# Patient Record
Sex: Female | Born: 1990 | Race: White | Hispanic: No | Marital: Single | Smoking: Never smoker
Health system: Southern US, Community
[De-identification: ages and names within clinical notes are randomized; demographics above are authoritative.]

## PROBLEM LIST (undated history)

## (undated) ENCOUNTER — Emergency Department (HOSPITAL_BASED_OUTPATIENT_CLINIC_OR_DEPARTMENT_OTHER): Admission: EM | Discharge status: Absent without leave | Payer: BC Managed Care – PPO

## (undated) DIAGNOSIS — J45909 Unspecified asthma, uncomplicated: Secondary | ICD-10-CM

## (undated) DIAGNOSIS — Z9109 Other allergy status, other than to drugs and biological substances: Secondary | ICD-10-CM

## (undated) DIAGNOSIS — N946 Dysmenorrhea, unspecified: Secondary | ICD-10-CM

## (undated) HISTORY — DX: Dysmenorrhea, unspecified: N94.6

## (undated) HISTORY — DX: Unspecified asthma, uncomplicated: J45.909

## (undated) HISTORY — DX: Other allergy status, other than to drugs and biological substances: Z91.09

---

## 2017-03-01 ENCOUNTER — Ambulatory Visit: Payer: BC Managed Care – PPO | Admitting: Obstetrics and Gynecology

## 2017-03-01 ENCOUNTER — Other Ambulatory Visit (HOSPITAL_COMMUNITY)
Admission: RE | Admit: 2017-03-01 | Discharge: 2017-03-01 | Disposition: A | Discharge status: Home / Self Care | Payer: BC Managed Care – PPO | Source: Ambulatory Visit | Attending: Obstetrics and Gynecology | Admitting: Obstetrics and Gynecology

## 2017-03-01 ENCOUNTER — Encounter: Payer: Self-pay | Admitting: Obstetrics and Gynecology

## 2017-03-01 ENCOUNTER — Other Ambulatory Visit: Payer: Self-pay

## 2017-03-01 VITALS — BP 122/78 | HR 92 | Resp 14 | Ht 64.5 in | Wt 137.0 lb

## 2017-03-01 DIAGNOSIS — Z124 Encounter for screening for malignant neoplasm of cervix: Secondary | ICD-10-CM | POA: Insufficient documentation

## 2017-03-01 DIAGNOSIS — Z0000 Encounter for general adult medical examination without abnormal findings: Secondary | ICD-10-CM

## 2017-03-01 DIAGNOSIS — Z01419 Encounter for gynecological examination (general) (routine) without abnormal findings: Secondary | ICD-10-CM

## 2017-03-01 DIAGNOSIS — J45909 Unspecified asthma, uncomplicated: Secondary | ICD-10-CM | POA: Insufficient documentation

## 2017-03-01 MED ORDER — LEVONORGESTREL-ETHINYL ESTRAD 0.1-20 MG-MCG PO TABS: 1.0000 | ORAL_TABLET | Freq: Every day | ORAL | 3 refills | Status: DC

## 2017-03-01 NOTE — Patient Instructions (Signed)

## 2017-03-01 NOTE — Progress Notes (Signed)
27 y.o. G0P0000 SingleCaucasianF here for annual exam.   Period Cycle (Days): 28 Period Duration (Days): 3 days  Period Pattern: Regular Menstrual Flow: Moderate Menstrual Control: Maxi pad, Tampon Menstrual Control Change Freq (Hours): changes tampon every 4-5 hours  Dysmenorrhea: (!) Moderate Dysmenorrhea Symptoms: Cramping  Cramps are tolerable. Lives with her boyfriend of 3 years.   Patient's last menstrual period was 02/15/2017.          Sexually active: Yes.    The current method of family planning is OCP (estrogen/progesterone).    Exercising: Yes.    cardio/ weights  Smoker:  no  Health Maintenance: Pap:  2018 WNL per patient ([place]) History of abnormal Pap:  no TDaP:  unsure Gardasil: no    reports that  has never smoked. she has never used smokeless tobacco. She reports that she drinks about 0.6 oz of alcohol per week. She reports that she does not use drugs. 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 Boyfriend is a 1st year Careers information officer.   Past Medical History:  Diagnosis Date  . Asthma   . Dysmenorrhea   . Environmental allergies     History reviewed. No pertinent surgical history.  Current Outpatient Medications  Medication Sig Dispense Refill  . albuterol (VENTOLIN HFA) 108 (90 Base) MCG/ACT inhaler Inhale into the lungs every 6 (six) hours as needed for wheezing or shortness of breath.    . cetirizine (ZYRTEC) 10 MG tablet Take 10 mg by mouth daily.    Marland Kitchen levonorgestrel-ethinyl estradiol (ORSYTHIA) 0.1-20 MG-MCG tablet Take 1 tablet by mouth daily.     No current facility-administered medications for this visit.     Family History  Problem Relation Age of Onset  . Pancreatic cancer Maternal Grandmother   . Breast cancer Maternal Grandmother   Early 70's with breast cancer, late 70's with breast cancer. Doesn't know dad's FH.  Review of Systems  Constitutional: Negative.   HENT: Negative.   Eyes: Negative.   Respiratory: Negative.   Cardiovascular:  Negative.   Gastrointestinal: Negative.   Endocrine: Negative.   Genitourinary: Negative.   Musculoskeletal: Negative.   Skin: Negative.   Allergic/Immunologic: Negative.   Neurological: Negative.   Psychiatric/Behavioral: Negative.     Exam:   BP 122/78 (BP Location: Right Arm, Patient Position: Sitting, Cuff Size: Normal)   Pulse 92   Resp 14   Ht 5' 4.5" (1.638 m)   Wt 137 lb (62.1 kg)   LMP 02/15/2017   BMI 23.15 kg/m   Weight change: @WEIGHTCHANGE @ Height:   Height: 5' 4.5" (163.8 cm)  Ht Readings from Last 3 Encounters:  03/01/17 5' 4.5" (1.638 m)    General appearance: alert, cooperative and appears stated age Head: Normocephalic, without obvious abnormality, atraumatic Neck: no adenopathy, supple, symmetrical, trachea midline and thyroid normal to inspection and palpation Lungs: clear to auscultation bilaterally Cardiovascular: regular rate and rhythm Breasts: normal appearance, no masses or tenderness Abdomen: soft, non-tender; non distended,  no masses,  no organomegaly Extremities: extremities normal, atraumatic, no cyanosis or edema Skin: Skin color, texture, turgor normal. No rashes or lesions Lymph nodes: Cervical, supraclavicular, and axillary nodes normal. No abnormal inguinal nodes palpated Neurologic: Grossly normal   Pelvic: External genitalia:  no lesions              Urethra:  normal appearing urethra with no masses, tenderness or lesions              Bartholins and Skenes: normal  Vagina: normal appearing vagina with normal color and discharge, no lesions              Cervix: no lesions               Bimanual Exam:  Uterus:  normal size, contour, position, consistency, mobility, non-tender and anteverted              Adnexa: no mass, fullness, tenderness               Rectovaginal: Confirms               Anus:  normal sphincter tone, no lesions  Chaperone was present for exam.  A:  Well Woman with normal exam  P:   Pap with  reflex hpv  Screening labs  Discussed breast self exam  Discussed calcium and vit D intake

## 2017-03-02 LAB — COMPREHENSIVE METABOLIC PANEL
ALBUMIN: 4.5 g/dL (ref 3.5–5.5)
ALT: 55 IU/L — ABNORMAL HIGH (ref 0–32)
AST: 37 IU/L (ref 0–40)
Albumin/Globulin Ratio: 1.7 (ref 1.2–2.2)
Alkaline Phosphatase: 86 IU/L (ref 39–117)
BILIRUBIN TOTAL: 0.3 mg/dL (ref 0.0–1.2)
BUN / CREAT RATIO: 16 (ref 9–23)
BUN: 15 mg/dL (ref 6–20)
CHLORIDE: 106 mmol/L (ref 96–106)
CO2: 22 mmol/L (ref 20–29)
CREATININE: 0.94 mg/dL (ref 0.57–1.00)
Calcium: 9.6 mg/dL (ref 8.7–10.2)
GFR calc non Af Amer: 84 mL/min/{1.73_m2} (ref >59)
GFR, EST AFRICAN AMERICAN: 97 mL/min/{1.73_m2} (ref >59)
GLOBULIN, TOTAL: 2.7 g/dL (ref 1.5–4.5)
GLUCOSE: 74 mg/dL (ref 65–99)
Potassium: 4.9 mmol/L (ref 3.5–5.2)
SODIUM: 143 mmol/L (ref 134–144)
TOTAL PROTEIN: 7.2 g/dL (ref 6.0–8.5)

## 2017-03-02 LAB — CBC
HEMATOCRIT: 41.6 % (ref 34.0–46.6)
HEMOGLOBIN: 13.7 g/dL (ref 11.1–15.9)
MCH: 29.5 pg (ref 26.6–33.0)
MCHC: 32.9 g/dL (ref 31.5–35.7)
MCV: 90 fL (ref 79–97)
Platelets: 227 10*3/uL (ref 150–379)
RBC: 4.65 x10E6/uL (ref 3.77–5.28)
RDW: 13.7 % (ref 12.3–15.4)
WBC: 8.9 10*3/uL (ref 3.4–10.8)

## 2017-03-02 LAB — LIPID PANEL
CHOLESTEROL TOTAL: 197 mg/dL (ref 100–199)
Chol/HDL Ratio: 3 ratio (ref 0.0–4.4)
HDL: 65 mg/dL (ref >39)
LDL CALC: 107 mg/dL — AB (ref 0–99)
Triglycerides: 125 mg/dL (ref 0–149)
VLDL Cholesterol Cal: 25 mg/dL (ref 5–40)

## 2017-03-03 ENCOUNTER — Telehealth: Payer: Self-pay | Admitting: *Deleted

## 2017-03-03 DIAGNOSIS — R899 Unspecified abnormal finding in specimens from other organs, systems and tissues: Secondary | ICD-10-CM

## 2017-03-03 LAB — CYTOLOGY - PAP: Diagnosis: NEGATIVE

## 2017-03-03 NOTE — Telephone Encounter (Signed)
-----   Message from Romualdo BolkJill Evelyn Jertson, MD sent at 03/03/2017 11:17 AM EST ----- Please inform the patient that one of her LFT's was slightly elevated. She should avoid ETOH and tylenol and have a repeat liver panel checked in 1-2 months. If still elevated will further evaluate Her LDL was minimally elevated, not significant.  Pap is pending

## 2017-03-03 NOTE — Telephone Encounter (Signed)
Left message to call regarding lab results -eh 

## 2017-03-15 NOTE — Telephone Encounter (Signed)
Patient returned call to Emily. °

## 2017-03-15 NOTE — Telephone Encounter (Signed)
Left message for patient to return call.

## 2017-03-15 NOTE — Telephone Encounter (Signed)
Returned call to patient. Patient notified of results and verbalized understanding. Lab appointment scheduled for Wednesday 04/12/17 at 1530. Patient agreeable to date and time of appointment. Future order placed for liver panel. Patient advised to avoid ETOH and tylenol prior to appointment. Patient agreeable.   Patient agreeable to disposition. Will close encounter.

## 2017-03-26 ENCOUNTER — Emergency Department (HOSPITAL_COMMUNITY): Payer: BC Managed Care – PPO

## 2017-03-26 ENCOUNTER — Other Ambulatory Visit: Payer: Self-pay

## 2017-03-26 ENCOUNTER — Encounter (HOSPITAL_COMMUNITY): Payer: Self-pay | Admitting: Emergency Medicine

## 2017-03-26 ENCOUNTER — Emergency Department (HOSPITAL_COMMUNITY)
Admission: EM | Admit: 2017-03-26 | Discharge: 2017-03-26 | Disposition: A | Discharge status: Home / Self Care | Payer: BC Managed Care – PPO | Attending: Emergency Medicine | Admitting: Emergency Medicine

## 2017-03-26 DIAGNOSIS — R1031 Right lower quadrant pain: Secondary | ICD-10-CM | POA: Insufficient documentation

## 2017-03-26 DIAGNOSIS — N390 Urinary tract infection, site not specified: Secondary | ICD-10-CM | POA: Insufficient documentation

## 2017-03-26 DIAGNOSIS — R319 Hematuria, unspecified: Secondary | ICD-10-CM | POA: Diagnosis not present

## 2017-03-26 DIAGNOSIS — J45909 Unspecified asthma, uncomplicated: Secondary | ICD-10-CM | POA: Diagnosis not present

## 2017-03-26 DIAGNOSIS — R109 Unspecified abdominal pain: Secondary | ICD-10-CM

## 2017-03-26 LAB — COMPREHENSIVE METABOLIC PANEL
ALBUMIN: 4 g/dL (ref 3.5–5.0)
ALK PHOS: 55 U/L (ref 38–126)
ALT: 21 U/L (ref 14–54)
ANION GAP: 12 (ref 5–15)
AST: 27 U/L (ref 15–41)
BILIRUBIN TOTAL: 0.8 mg/dL (ref 0.3–1.2)
BUN: 11 mg/dL (ref 6–20)
CALCIUM: 9.1 mg/dL (ref 8.9–10.3)
CO2: 20 mmol/L — AB (ref 22–32)
Chloride: 104 mmol/L (ref 101–111)
Creatinine, Ser: 1.18 mg/dL — ABNORMAL HIGH (ref 0.44–1.00)
GFR calc non Af Amer: >60 mL/min (ref >60)
Glucose, Bld: 95 mg/dL (ref 65–99)
POTASSIUM: 3.5 mmol/L (ref 3.5–5.1)
SODIUM: 136 mmol/L (ref 135–145)
TOTAL PROTEIN: 7.2 g/dL (ref 6.5–8.1)

## 2017-03-26 LAB — CBC
HEMATOCRIT: 43.6 % (ref 36.0–46.0)
HEMOGLOBIN: 14.6 g/dL (ref 12.0–15.0)
MCH: 30 pg (ref 26.0–34.0)
MCHC: 33.5 g/dL (ref 30.0–36.0)
MCV: 89.7 fL (ref 78.0–100.0)
Platelets: 172 10*3/uL (ref 150–400)
RBC: 4.86 MIL/uL (ref 3.87–5.11)
RDW: 13.7 % (ref 11.5–15.5)
WBC: 4.8 10*3/uL (ref 4.0–10.5)

## 2017-03-26 LAB — URINALYSIS, ROUTINE W REFLEX MICROSCOPIC
Bilirubin Urine: NEGATIVE
Glucose, UA: NEGATIVE mg/dL
Ketones, ur: 5 mg/dL — AB
Nitrite: NEGATIVE
PH: 5 (ref 5.0–8.0)
Protein, ur: 30 mg/dL — AB
SPECIFIC GRAVITY, URINE: 1.031 — AB (ref 1.005–1.030)

## 2017-03-26 LAB — LIPASE, BLOOD: Lipase: 20 U/L (ref 11–51)

## 2017-03-26 LAB — I-STAT BETA HCG BLOOD, ED (MC, WL, AP ONLY)

## 2017-03-26 MED ORDER — IOPAMIDOL (ISOVUE-300) INJECTION 61%
INTRAVENOUS | Status: AC
  Administered 2017-03-26: 100 mL
  Filled 2017-03-26: qty 100

## 2017-03-26 MED ORDER — NITROFURANTOIN MONOHYD MACRO 100 MG PO CAPS: 100.0000 mg | ORAL_CAPSULE | Freq: Two times a day (BID) | ORAL | 0 refills | Status: DC

## 2017-03-26 MED ORDER — ONDANSETRON HCL 4 MG PO TABS: 4.0000 mg | ORAL_TABLET | Freq: Four times a day (QID) | ORAL | 0 refills | Status: DC

## 2017-03-26 MED ORDER — TRAMADOL HCL 50 MG PO TABS: 50.0000 mg | ORAL_TABLET | Freq: Four times a day (QID) | ORAL | 0 refills | Status: DC | PRN

## 2017-03-26 NOTE — ED Triage Notes (Signed)
Pt. Stated, yesterday I had N/V and only drank Gatorade and some chicken broth. It went away. Today Im having only bad stomach pain.Went to [facility] told just to come here.

## 2017-03-26 NOTE — Discharge Instructions (Addendum)
Please read attached information. If you experience any new or worsening signs or symptoms please return to the emergency room for evaluation. If your symptoms persist beyond two days please return for evaluation. Please use medication prescribed only as directed and discontinue taking if you have any concerning signs or symptoms.

## 2017-03-26 NOTE — ED Provider Notes (Signed)
[facility] Provider Note   CSN: [id number] Arrival date & time: 03/26/17  1147   History   Chief Complaint Chief Complaint  Patient presents with  . Abdominal Pain    HPI Melanie Shields is a 27 y.o. female.  HPI    27 year old female presents today with complaints of abdominal pain.  Patient notes over the last 3 days she has had nausea vomiting and diarrhea and minor abdominal cramping.  She notes the symptoms improved with no vomiting or diarrhea but today developed right lower quadrant abdominal pain.  She notes this is generally uncomfortable throughout worsening of the periumbilical region and more severe right lower quadrant.  Patient notes she is tolerating p.o., she reports originally she did have a fever no longer febrile.  She denies any vaginal discharge or bleeding.  Patient reports symptoms are made worse with walking or movement.     Past Medical History:  Diagnosis Date  . Asthma   . Dysmenorrhea   . Environmental allergies     Patient Active Problem List   Diagnosis Date Noted  . Asthma     History reviewed. No pertinent surgical history.   OB History    Gravida  0   Para  0   Term  0   Preterm  0   AB  0   Living  0     SAB  0   TAB  0   Ectopic  0   Multiple  0   Live Births  0           Home Medications    Prior to Admission medications   Medication Sig Start Date End Date Taking? Authorizing Provider  albuterol (VENTOLIN HFA) 108 (90 Base) MCG/ACT inhaler Inhale into the lungs every 6 (six) hours as needed for wheezing or shortness of breath.   Yes [provider]  levonorgestrel-ethinyl estradiol (ORSYTHIA) 0.1-20 MG-MCG tablet Take 1 tablet by mouth daily. 03/01/17  Yes Romualdo BolkJertson, Jill Evelyn, MD  cetirizine (ZYRTEC) 10 MG tablet Take 10 mg by mouth daily.    [provider]  nitrofurantoin, macrocrystal-monohydrate, (MACROBID) 100 MG capsule Take 1 capsule (100  mg total) by mouth 2 (two) times daily. 03/26/17   Jacarius Handel, Tinnie GensJeffrey, PA-C  ondansetron (ZOFRAN) 4 MG tablet Take 1 tablet (4 mg total) by mouth every 6 (six) hours. 03/26/17   Nayelie Gionfriddo, Tinnie GensJeffrey, PA-C  traMADol (ULTRAM) 50 MG tablet Take 1 tablet (50 mg total) by mouth every 6 (six) hours as needed. 03/26/17   Eyvonne MechanicHedges, Norvil Martensen, PA-C    Family History Family History  Problem Relation Age of Onset  . Pancreatic cancer Maternal Grandmother   . Breast cancer Maternal Grandmother    Social History Social History   Tobacco Use  . Smoking status: Never Smoker  . Smokeless tobacco: Never Used  Substance Use Topics  . Alcohol use: Yes    Alcohol/week: 0.6 oz    Types: 1 Standard drinks or equivalent per week  . Drug use: No     Allergies   Amoxicillin and Omnicef [cefdinir]   Review of Systems Review of Systems  All other systems reviewed and are negative.   Physical Exam Updated Vital Signs BP (!) 114/93   Pulse 91   Temp 98 F (36.7 C) (Oral)   Resp 16   Ht 5\' 5"  (1.651 m)   Wt 61.2 kg (135 lb)   LMP 03/05/2017   SpO2 100%   BMI 22.47  kg/m   Physical Exam  Constitutional: She is oriented to person, place, and time. She appears well-developed and well-nourished.  HENT:  Head: Normocephalic and atraumatic.  Eyes: Pupils are equal, round, and reactive to light. Conjunctivae are normal. Right eye exhibits no discharge. Left eye exhibits no discharge. No scleral icterus.  Neck: Normal range of motion. No JVD present. No tracheal deviation present.  Pulmonary/Chest: Effort normal. No stridor.  Abdominal:  Abdomen with generalized tenderness, nonfocal  Neurological: She is alert and oriented to person, place, and time. Coordination normal.  Psychiatric: She has a normal mood and affect. Her behavior is normal. Judgment and thought content normal.  Nursing note and vitals reviewed.    ED Treatments / Results  Labs (all labs ordered are listed, but only abnormal results are  displayed) Labs Reviewed  COMPREHENSIVE METABOLIC PANEL - Abnormal; Notable for the following components:      Result Value   CO2 20 (*)    Creatinine, Ser 1.18 (*)    All other components within normal limits  URINALYSIS, ROUTINE W REFLEX MICROSCOPIC - Abnormal; Notable for the following components:   Color, Urine AMBER (*)    APPearance CLOUDY (*)    Specific Gravity, Urine 1.031 (*)    Hgb urine dipstick MODERATE (*)    Ketones, ur 5 (*)    Protein, ur 30 (*)    Leukocytes, UA LARGE (*)    Bacteria, UA FEW (*)    Squamous Epithelial / LPF 0-5 (*)    All other components within normal limits  LIPASE, BLOOD  CBC  I-STAT BETA HCG BLOOD, ED ([facility], [facility], [facility] ONLY)    EKG None  Radiology Ct Abdomen Pelvis W Contrast  Result Date: 03/26/2017 CLINICAL DATA:  Nausea and vomiting yesterday. Right lower quadrant pain today. Appendicitis suspected. EXAM: CT ABDOMEN AND PELVIS WITH CONTRAST TECHNIQUE: Multidetector CT imaging of the abdomen and pelvis was performed using the standard protocol following bolus administration of intravenous contrast. CONTRAST:  ISOVUE-300 IOPAMIDOL (ISOVUE-300) INJECTION 61% COMPARISON:  None. FINDINGS: Lower chest: No acute abnormality. Hepatobiliary: No focal liver abnormality is seen. No gallstones, gallbladder wall thickening, or biliary dilatation. Pancreas: Unremarkable. No pancreatic ductal dilatation or surrounding inflammatory changes. Spleen: Normal in size without focal abnormality. Adrenals/Urinary Tract: Adrenal glands are unremarkable. Kidneys are normal, without renal calculi, focal lesion, or hydronephrosis. Bladder is unremarkable. Stomach/Bowel: The stomach and small bowel are normal. The colon is normal. High attenuation the proximal appendix could represent a small appendicolith versus contrast given contrast in adjacent colon. The appendix measures up to 7 mm which is borderline. However, there is no adjacent stranding. The proximal appendix is  fluid-filled above the more distal appendix is thin walled and air-filled. Vascular/Lymphatic: The aorta and branching vessels are normal. A few shotty nodes are seen in the left periaortic region, likely reactive. A representative node on series 3, image 27 measures 10 mm. No adenopathy. Reproductive: Uterus and bilateral adnexa are unremarkable. Other: No free air or free fluid.  No other acute abnormalities. Musculoskeletal: No acute or significant osseous findings. IMPRESSION: 1. There is no convincing evidence of appendicitis. While there is the possibility of a proximal appendicolith and a borderline caliber of the proximal appendix, the lack of adjacent stranding, significant wall thickening, and a normal distal appendix suggests no appendicitis. If there is high clinical concern, a short-term follow-up CT scan could be performed. Electronically Signed   By: Gerome Sam III M.D   On: 03/26/2017 18:01  Procedures Procedures (including critical care time)  Medications Ordered in ED Medications  iopamidol (ISOVUE-300) 61 % injection (100 mLs  Contrast Given 03/26/17 1657)     Initial Impression / Assessment and Plan / ED Course  I have reviewed the triage vital signs and the nursing notes.  Pertinent labs & imaging results that were available during my care of the patient were reviewed by me and considered in my medical decision making (see chart for details).      Final Clinical Impressions(s) / ED Diagnoses   Final diagnoses:  Urinary tract infection with hematuria, site unspecified  Abdominal pain, unspecified abdominal location    Labs: CBC, CMP, lipase-creatinine 1.18 -likely secondary to dehydration  Imaging: CT abdomen pelvis  Consults:  Therapeutics:  Discharge Meds: Macrobid, Zofran, Ultram   Assessment/Plan: 27 year old female presents today with complaints of abdominal pain.  Patient complains of right lower quadrant abdominal pain but has tenderness  generalized and nonfocal.  She had serial abdominal exams with different areas causing tenderness.  Patient is afebrile well-appearing in no acute distress.  She has no elevation in her white count at this time.  CT abdomen pelvis without acute findings, low suspicion for acute appendicitis, diverticulitis, ovarian torsion, or any other life-threatening etiology at this time.  Patient does have urine consistent with urinary tract infection, she is asymptomatic but given her abdominal pain will be treated.  I discussed that although findings at this time showed no acute abnormalities patient persisted with abdominal pain or had any new or worsening signs or symptoms she needed to return to the emergency room.  Patient reports that she will return in 2 days if symptoms persist for repeat evaluation.  Patient instructed use medication as directed, she verbalized her understanding and agreement to today's plan had no further questions or concerns at the time of discharge.    ED Discharge Orders        Ordered    nitrofurantoin, macrocrystal-monohydrate, (MACROBID) 100 MG capsule  2 times daily     03/26/17 1816    ondansetron (ZOFRAN) 4 MG tablet  Every 6 hours     03/26/17 1817    traMADol (ULTRAM) 50 MG tablet  Every 6 hours PRN     03/26/17 1817       Eyvonne Mechanic, PA-C 03/26/17 Vinetta Bergamo, MD 03/27/17 1401

## 2017-04-12 ENCOUNTER — Other Ambulatory Visit: Payer: BC Managed Care – PPO

## 2017-04-12 ENCOUNTER — Telehealth: Payer: Self-pay | Admitting: Obstetrics and Gynecology

## 2017-04-12 NOTE — Telephone Encounter (Signed)
Patient called and cancelled her appointment today for a liver panel. She said she is sick and will call back to reschedule. I returned her call to let her we got the message.

## 2018-02-03 ENCOUNTER — Other Ambulatory Visit: Payer: Self-pay | Admitting: Obstetrics and Gynecology

## 2018-02-05 NOTE — Telephone Encounter (Signed)
Message left to return call to [facility] at [phone].    Need patient to advise if she needs a refill prior to appointment in March and if so, confirm which pharmacy she needs refill to go to.

## 2018-02-09 ENCOUNTER — Other Ambulatory Visit: Payer: Self-pay | Admitting: Obstetrics and Gynecology

## 2018-02-09 NOTE — Telephone Encounter (Signed)
Patient returning a call about a prescription refill.

## 2018-02-12 NOTE — Telephone Encounter (Signed)
Message left to return call to Melanie Shields at [phone].    

## 2018-02-15 MED ORDER — LEVONORGESTREL-ETHINYL ESTRAD 0.1-20 MG-MCG PO TABS: 1.0000 | ORAL_TABLET | Freq: Every day | ORAL | 0 refills | Status: DC

## 2018-02-15 NOTE — Telephone Encounter (Signed)
Medication refill request: Orsythia Last AEX:  03-01-17 JJ  Next AEX: 03-19-2018 Last MMG (if hormonal medication request): n/a Refill authorized: 03-01-17 #3packs, 3RF. Today, please advise.   Spoke with patient. Patient states she will need one pack of OCP refilled prior to appointment on 03-19-2018 with Dr. Oscar La. Medication pended for #1pack, 0RF to pharmacy confirmed as [facility] on [address] in [place]. Please refill if appropriate.

## 2018-03-19 ENCOUNTER — Ambulatory Visit: Payer: BC Managed Care – PPO | Admitting: Obstetrics and Gynecology

## 2018-03-19 ENCOUNTER — Telehealth: Payer: Self-pay | Admitting: Obstetrics and Gynecology

## 2018-03-19 NOTE — Telephone Encounter (Signed)
Patient called and cancelled her AEX with Dr. Oscar La today because she is not feeling well. Patient advised to check in with her PCP about her symptoms and she expressed understanding. She rescheduled with Dr. Oscar La to 04/04/18 at 1:30.

## 2018-03-19 NOTE — Progress Notes (Deleted)
28 y.o. G0P0000 Single White or Caucasian Not Hispanic or Latino female here for annual exam.      No LMP recorded.          Sexually active: {yes no:314532}  The current method of family planning is {contraception:315051}.    Exercising: {yes no:314532}  {types:19826} Smoker:  {YES NO:22349}  Health Maintenance: Pap:03/01/17 nofrma   2018 WNL  Per patient  History of abnormal Pap:  no TDaP:  *** Gardasil: ***   reports that she has never smoked. She has never used smokeless tobacco. She reports current alcohol use of about 1.0 standard drinks of alcohol per week. She reports that she does not use drugs.  Past Medical History:  Diagnosis Date  . Asthma   . Dysmenorrhea   . Environmental allergies     No past surgical history on file.  Current Outpatient Medications  Medication Sig Dispense Refill  . albuterol (VENTOLIN HFA) 108 (90 Base) MCG/ACT inhaler Inhale into the lungs every 6 (six) hours as needed for wheezing or shortness of breath.    . cetirizine (ZYRTEC) 10 MG tablet Take 10 mg by mouth daily.    Marland Kitchen levonorgestrel-ethinyl estradiol (ORSYTHIA) 0.1-20 MG-MCG tablet Take 1 tablet by mouth daily. 1 Package 0  . nitrofurantoin, macrocrystal-monohydrate, (MACROBID) 100 MG capsule Take 1 capsule (100 mg total) by mouth 2 (two) times daily. 10 capsule 0  . ondansetron (ZOFRAN) 4 MG tablet Take 1 tablet (4 mg total) by mouth every 6 (six) hours. 12 tablet 0  . traMADol (ULTRAM) 50 MG tablet Take 1 tablet (50 mg total) by mouth every 6 (six) hours as needed. 10 tablet 0   No current facility-administered medications for this visit.     Family History  Problem Relation Age of Onset  . Pancreatic cancer Maternal Grandmother   . Breast cancer Maternal Grandmother     Review of Systems  Exam:   There were no vitals taken for this visit.  Weight change: @WEIGHTCHANGE @ Height:      Ht Readings from Last 3 Encounters:  03/26/17 5\' 5"  (1.651 m)  03/01/17 5' 4.5" (1.638 m)     General appearance: alert, cooperative and appears stated age Head: Normocephalic, without obvious abnormality, atraumatic Neck: no adenopathy, supple, symmetrical, trachea midline and thyroid {CHL AMB PHY EX THYROID NORM DEFAULT:[phone]::"normal to inspection and palpation"} Lungs: clear to auscultation bilaterally Cardiovascular: regular rate and rhythm Breasts: {Exam; breast:13139::"normal appearance, no masses or tenderness"} Abdomen: soft, non-tender; non distended,  no masses,  no organomegaly Extremities: extremities normal, atraumatic, no cyanosis or edema Skin: Skin color, texture, turgor normal. No rashes or lesions Lymph nodes: Cervical, supraclavicular, and axillary nodes normal. No abnormal inguinal nodes palpated Neurologic: Grossly normal   Pelvic: External genitalia:  no lesions              Urethra:  normal appearing urethra with no masses, tenderness or lesions              Bartholins and Skenes: normal                 Vagina: normal appearing vagina with normal color and discharge, no lesions              Cervix: {CHL AMB PHY EX CERVIX NORM DEFAULT:[phone]::"no lesions"}               Bimanual Exam:  Uterus:  {CHL AMB PHY EX UTERUS NORM DEFAULT:[phone]::"normal size, contour, position, consistency, mobility, non-tender"}  Adnexa: {CHL AMB PHY EX ADNEXA NO MASS DEFAULT:[phone]::"no mass, fullness, tenderness"}               Rectovaginal: Confirms               Anus:  normal sphincter tone, no lesions  Chaperone was present for exam.  A:  Well Woman with normal exam  P:

## 2018-04-04 ENCOUNTER — Ambulatory Visit: Payer: BC Managed Care – PPO | Admitting: Obstetrics and Gynecology

## 2018-05-23 ENCOUNTER — Encounter: Payer: Self-pay | Admitting: Obstetrics and Gynecology

## 2018-05-23 ENCOUNTER — Other Ambulatory Visit: Payer: Self-pay

## 2018-05-23 ENCOUNTER — Ambulatory Visit: Payer: BC Managed Care – PPO | Admitting: Obstetrics and Gynecology

## 2018-05-23 VITALS — BP 122/80 | HR 88 | Temp 98.5°F | Ht 64.5 in | Wt 152.8 lb

## 2018-05-23 DIAGNOSIS — Z3041 Encounter for surveillance of contraceptive pills: Secondary | ICD-10-CM | POA: Diagnosis not present

## 2018-05-23 DIAGNOSIS — Z0000 Encounter for general adult medical examination without abnormal findings: Secondary | ICD-10-CM | POA: Diagnosis not present

## 2018-05-23 DIAGNOSIS — Z01419 Encounter for gynecological examination (general) (routine) without abnormal findings: Secondary | ICD-10-CM | POA: Diagnosis not present

## 2018-05-23 DIAGNOSIS — Z3169 Encounter for other general counseling and advice on procreation: Secondary | ICD-10-CM

## 2018-05-23 MED ORDER — LEVONORGESTREL-ETHINYL ESTRAD 0.1-20 MG-MCG PO TABS: 1.0000 | ORAL_TABLET | Freq: Every day | ORAL | 3 refills | Status: DC

## 2018-05-23 NOTE — Patient Instructions (Signed)
EXERCISE AND DIET:  We recommended that you start or continue a regular exercise program for good health. Regular exercise means any activity that makes your heart beat faster and makes you sweat.  We recommend exercising at least 30 minutes per day at least 3 days a week, preferably 4 or 5.  We also recommend a diet low in fat and sugar.  Inactivity, poor dietary choices and obesity can cause diabetes, heart attack, stroke, and kidney damage, among others.   ° °ALCOHOL AND SMOKING:  Women should limit their alcohol intake to no more than 7 drinks/beers/glasses of wine (combined, not each!) per week. Moderation of alcohol intake to this level decreases your risk of breast cancer and liver damage. And of course, no recreational drugs are part of a healthy lifestyle.  And absolutely no smoking or even second hand smoke. Most people know smoking can cause heart and lung diseases, but did you know it also contributes to weakening of your bones? Aging of your skin?  Yellowing of your teeth and nails? ° °CALCIUM AND VITAMIN D:  Adequate intake of calcium and Vitamin D are recommended.  The recommendations for exact amounts of these supplements seem to change often, but generally speaking 1,000 mg of calcium (between diet and supplement) and 800 units of Vitamin D per day seems prudent. Certain women may benefit from higher intake of Vitamin D.  If you are among these women, your doctor will have told you during your visit.   ° °PAP SMEARS:  Pap smears, to check for cervical cancer or precancers,  have traditionally been done yearly, although recent scientific advances have shown that most women can have pap smears less often.  However, every woman still should have a physical exam from her gynecologist every year. It will include a breast check, inspection of the vulva and vagina to check for abnormal growths or skin changes, a visual exam of the cervix, and then an exam to evaluate the size and shape of the uterus and  ovaries.  And after 28 years of age, a rectal exam is indicated to check for rectal cancers. We will also provide age appropriate advice regarding health maintenance, like when you should have certain vaccines, screening for sexually transmitted diseases, bone density testing, colonoscopy, mammograms, etc.  ° °MAMMOGRAMS:  All women over 40 years old should have a yearly mammogram. Many facilities now offer a "3D" mammogram, which may cost around $50 extra out of pocket. If possible,  we recommend you accept the option to have the 3D mammogram performed.  It both reduces the number of women who will be called back for extra views which then turn out to be normal, and it is better than the routine mammogram at detecting truly abnormal areas.   ° °COLON CANCER SCREENING: Now recommend starting at age 45. At this time colonoscopy is not covered for routine screening until 50. There are take home tests that can be done between 45-49.  ° °COLONOSCOPY:  Colonoscopy to screen for colon cancer is recommended for all women at age 50.  We know, you hate the idea of the prep.  We agree, BUT, having colon cancer and not knowing it is worse!!  Colon cancer so often starts as a polyp that can be seen and removed at colonscopy, which can quite literally save your life!  And if your first colonoscopy is normal and you have no family history of colon cancer, most women don't have to have it again for   10 years.  Once every ten years, you can do something that may end up saving your life, right?  We will be happy to help you get it scheduled when you are ready.  Be sure to check your insurance coverage so you understand how much it will cost.  It may be covered as a preventative service at no cost, but you should check your particular policy.   ° ° ° °Breast Self-Awareness °Breast self-awareness means being familiar with how your breasts look and feel. It involves checking your breasts regularly and reporting any changes to your  health care provider. °Practicing breast self-awareness is important. A change in your breasts can be a sign of a serious medical problem. Being familiar with how your breasts look and feel allows you to find any problems early, when treatment is more likely to be successful. All women should practice breast self-awareness, including women who have had breast implants. °How to do a breast self-exam °One way to learn what is normal for your breasts and whether your breasts are changing is to do a breast self-exam. To do a breast self-exam: °Look for Changes ° °1. Remove all the clothing above your waist. °2. Stand in front of a mirror in a room with good lighting. °3. Put your hands on your hips. °4. Push your hands firmly downward. °5. Compare your breasts in the mirror. Look for differences between them (asymmetry), such as: °? Differences in shape. °? Differences in size. °? Puckers, dips, and bumps in one breast and not the other. °6. Look at each breast for changes in your skin, such as: °? Redness. °? Scaly areas. °7. Look for changes in your nipples, such as: °? Discharge. °? Bleeding. °? Dimpling. °? Redness. °? A change in position. °Feel for Changes °Carefully feel your breasts for lumps and changes. It is best to do this while lying on your back on the floor and again while sitting or standing in the shower or tub with soapy water on your skin. Feel each breast in the following way: °· Place the arm on the side of the breast you are examining above your head. °· Feel your breast with the other hand. °· Start in the nipple area and make ¾ inch (2 cm) overlapping circles to feel your breast. Use the pads of your three middle fingers to do this. Apply light pressure, then medium pressure, then firm pressure. The light pressure will allow you to feel the tissue closest to the skin. The medium pressure will allow you to feel the tissue that is a little deeper. The firm pressure will allow you to feel the tissue  close to the ribs. °· Continue the overlapping circles, moving downward over the breast until you feel your ribs below your breast. °· Move one finger-width toward the center of the body. Continue to use the ¾ inch (2 cm) overlapping circles to feel your breast as you move slowly up toward your collarbone. °· Continue the up and down exam using all three pressures until you reach your armpit. ° °Write Down What You Find ° °Write down what is normal for each breast and any changes that you find. Keep a written record with breast changes or normal findings for each breast. By writing this information down, you do not need to depend only on memory for size, tenderness, or location. Write down where you are in your menstrual cycle, if you are still menstruating. °If you are having trouble noticing differences   in your breasts, do not get discouraged. With time you will become more familiar with the variations in your breasts and more comfortable with the exam. °How often should I examine my breasts? °Examine your breasts every month. If you are breastfeeding, the best time to examine your breasts is after a feeding or after using a breast pump. If you menstruate, the best time to examine your breasts is 5-7 days after your period is over. During your period, your breasts are lumpier, and it may be more difficult to notice changes. °When should I see my health care provider? °See your health care provider if you notice: °· A change in shape or size of your breasts or nipples. °· A change in the skin of your breast or nipples, such as a reddened or scaly area. °· Unusual discharge from your nipples. °· A lump or thick area that was not there before. °· Pain in your breasts. °· Anything that concerns you. ° °Preparing for Pregnancy °If you are considering becoming pregnant, make an appointment to see your regular health care provider to learn how to prepare for a safe and healthy pregnancy (preconception care). During a  preconception care visit, your health care provider will: °· Do a complete physical exam, including a Pap test. °· Take a complete medical history. °· Give you information, answer your questions, and help you resolve problems. °Preconception checklist °Medical history °· Tell your health care provider about any current or past medical conditions. Your pregnancy or your ability to become pregnant may be affected by chronic conditions, such as diabetes, chronic hypertension, and thyroid problems. °· Include your family's medical history as well as your partner's medical history. °· Tell your health care provider about any history of STIs (sexually transmitted infections). These can affect your pregnancy. In some cases, they can be passed to your baby. Discuss any concerns that you have about STIs. °· If indicated, discuss the benefits of genetic testing. This testing will show whether there are any genetic conditions that may be passed from you or your partner to your baby. °· Tell your health care provider about: °? Any problems you have had with conception or pregnancy. °? Any medicines you take. These include vitamins, herbal supplements, and over-the-counter medicines. °? Your history of immunizations. Discuss any vaccinations that you may need. °Diet °· Ask your health care provider what to include in a healthy diet that has a balance of nutrients. This is especially important when you are pregnant or preparing to become pregnant. °· Ask your health care provider to help you reach a healthy weight before pregnancy. °? If you are overweight, you may be at higher risk for certain complications, such as high blood pressure, diabetes, and preterm birth. °? If you are underweight, you are more likely to have a baby who has a low birth weight. °Lifestyle, work, and home °· Let your health care provider know: °? About any lifestyle habits that you have, such as alcohol use, drug use, or smoking. °? About recreational  activities that may put you at risk during pregnancy, such as downhill skiing and certain exercise programs. °? Tell your health care provider about any international travel, especially any travel to places with an active Zika virus outbreak. °? About harmful substances that you may be exposed to at work or at home. These include chemicals, pesticides, radiation, or even litter boxes. °? If you do not feel safe at home. °Mental health °· Tell your health care provider about: °?   Any history of mental health conditions, including feelings of depression, sadness, or anxiety. °? Any medicines that you take for a mental health condition. These include herbs and supplements. °Home instructions to prepare for pregnancy °Lifestyle ° °· Eat a balanced diet. This includes fresh fruits and vegetables, whole grains, lean meats, low-fat dairy products, healthy fats, and foods that are high in fiber. Ask to meet with a nutritionist or registered dietitian for assistance with meal planning and goals. °· Get regular exercise. Try to be active for at least 30 minutes a day on most days of the week. Ask your health care provider which activities are safe during pregnancy. °· Do not use any products that contain nicotine or tobacco, such as cigarettes and e-cigarettes. If you need help quitting, ask your health care provider. °· Do not drink alcohol. °· Do not take illegal drugs. °· Maintain a healthy weight. Ask your health care provider what weight range is right for you. °General instructions °· Keep an accurate record of your menstrual periods. This makes it easier for your health care provider to determine your baby's due date. °· Begin taking prenatal vitamins and folic acid supplements daily as directed by your health care provider. °· Manage any chronic conditions, such as high blood pressure and diabetes, as told by your health care provider. This is important. °How do I know that I am pregnant? °You may be pregnant if you  have been sexually active and you miss your period. Symptoms of early pregnancy include: °· Mild cramping. °· Very light vaginal bleeding (spotting). °· Feeling unusually tired. °· Nausea and vomiting (morning sickness). °If you have any of these symptoms and you suspect that you might be pregnant, you can take a home pregnancy test. These tests check for a hormone in your urine (human chorionic gonadotropin, or hCG). A woman's body begins to make this hormone during early pregnancy. These tests are very accurate. Wait until at least the first day after you miss your period to take one. If the test shows that you are pregnant (you get a positive result), call your health care provider to make an appointment for prenatal care. °What should I do if I become pregnant? ° °  ° °· Make an appointment with your health care provider as soon as you suspect you are pregnant. °· Do not use any products that contain nicotine, such as cigarettes, chewing tobacco, and e-cigarettes. If you need help quitting, ask your health care provider. °· Do not drink alcoholic beverages. Alcohol is related to a number of birth defects. °· Avoid toxic odors and chemicals. °· You may continue to have sexual intercourse if it does not cause pain or other problems, such as vaginal bleeding. °This information is not intended to replace advice given to you by your health care provider. Make sure you discuss any questions you have with your health care provider. °Document Released: 12/03/2007 Document Revised: 12/22/2016 Document Reviewed: 07/12/2015 °Elsevier Interactive Patient Education © 2019 [employer] Inc[employer] ° °

## 2018-05-23 NOTE — Progress Notes (Signed)
28 y.o. G0P0000 Single White or Caucasian Not Hispanic or Latino female here for annual exam.   Period Cycle (Days): 28 Period Duration (Days): 3-4 days Period Pattern: Regular Menstrual Flow: Heavy, Light Menstrual Control: Tampon, Thin pad Menstrual Control Change Freq (Hours): changes pad/tampon every 3 hours Dysmenorrhea: (!) Moderate Dysmenorrhea Symptoms: Cramping  Got engaged, getting married in 7/21. No dyspareunia. Considering conception after she gets married. Has had the varicella vaccination. No FH of chromosomal or genetic abnormalities.   Patient's last menstrual period was 05/11/2018 (exact date).          Sexually active: Yes.    The current method of family planning is OCP (estrogen/progesterone).    Exercising: Yes.    running Smoker:  no  Health Maintenance: Pap:  03/01/2017 WNL History of abnormal Pap:  no TDaP:  unsure, thinks she got it 2 years ago. Gardasil: no    reports that she has never smoked. She has never used smokeless tobacco. She reports current alcohol use of about 1.0 standard drinks of alcohol per week. She reports that she does not use drugs. 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 Fiance is a 2nd year Careers information officer.   Past Medical History:  Diagnosis Date  . Asthma   . Dysmenorrhea   . Environmental allergies     History reviewed. No pertinent surgical history.  Current Outpatient Medications  Medication Sig Dispense Refill  . albuterol (VENTOLIN HFA) 108 (90 Base) MCG/ACT inhaler Inhale into the lungs every 6 (six) hours as needed for wheezing or shortness of breath.    . cetirizine (ZYRTEC) 10 MG tablet Take 10 mg by mouth daily.    . Fluticasone Furoate-Vilanterol (BREO ELLIPTA IN) Inhale into the lungs.    Marland Kitchen levonorgestrel-ethinyl estradiol (ORSYTHIA) 0.1-20 MG-MCG tablet Take 1 tablet by mouth daily. 1 Package 0   No current facility-administered medications for this visit.     Family History  Problem Relation Age of Onset   . Pancreatic cancer Maternal Grandmother   . Breast cancer Maternal Grandmother     Review of Systems  Constitutional: Negative.   HENT: Negative.   Eyes: Negative.   Respiratory: Negative.   Cardiovascular: Negative.   Gastrointestinal: Negative.   Endocrine: Negative.   Genitourinary: Negative.   Musculoskeletal: Negative.   Skin: Negative.   Allergic/Immunologic: Negative.   Neurological: Negative.   Hematological: Negative.   Psychiatric/Behavioral: Negative.     Exam:   BP 122/80 (BP Location: Right Arm, Patient Position: Sitting, Cuff Size: Normal)   Pulse 88   Temp 98.5 F (36.9 C) (Skin)   Ht 5' 4.5" (1.638 m)   Wt 152 lb 12.8 oz (69.3 kg)   LMP 05/11/2018 (Exact Date)   BMI 25.82 kg/m   Weight change: @WEIGHTCHANGE @ Height:   Height: 5' 4.5" (163.8 cm)  Ht Readings from Last 3 Encounters:  05/23/18 5' 4.5" (1.638 m)  03/26/17 5\' 5"  (1.651 m)  03/01/17 5' 4.5" (1.638 m)    General appearance: alert, cooperative and appears stated age Head: Normocephalic, without obvious abnormality, atraumatic Neck: no adenopathy, supple, symmetrical, trachea midline and thyroid normal to inspection and palpation Lungs: clear to auscultation bilaterally Cardiovascular: regular rate and rhythm Breasts: normal appearance, no masses or tenderness Abdomen: soft, non-tender; non distended,  no masses,  no organomegaly Extremities: extremities normal, atraumatic, no cyanosis or edema Skin: Skin color, texture, turgor normal. No rashes or lesions Lymph nodes: Cervical, supraclavicular, and axillary nodes normal. No abnormal inguinal nodes  palpated Neurologic: Grossly normal   Pelvic: External genitalia:  no lesions              Urethra:  normal appearing urethra with no masses, tenderness or lesions              Bartholins and Skenes: normal                 Vagina: normal appearing vagina with normal color and discharge, no lesions              Cervix: no lesions                Bimanual Exam:  Uterus:  normal size, contour, position, consistency, mobility, non-tender              Adnexa: no mass, fullness, tenderness               Rectovaginal: Confirms               Anus:  normal sphincter tone, no lesions  Chaperone was present for exam.  A:  Well Woman with normal exam  Considering conception next year.   P:   No pap this year  Continue OCP's  Discussed breast self exam  Discussed calcium and vit D intake  Screening labs, rubella  On multivitamin

## 2018-05-24 LAB — COMPREHENSIVE METABOLIC PANEL
ALT: 96 IU/L — ABNORMAL HIGH (ref 0–32)
AST: 45 IU/L — ABNORMAL HIGH (ref 0–40)
Albumin/Globulin Ratio: 1.8 (ref 1.2–2.2)
Albumin: 4.4 g/dL (ref 3.9–5.0)
Alkaline Phosphatase: 136 IU/L — ABNORMAL HIGH (ref 39–117)
BUN/Creatinine Ratio: 11 (ref 9–23)
BUN: 10 mg/dL (ref 6–20)
Bilirubin Total: 0.3 mg/dL (ref 0.0–1.2)
CO2: 22 mmol/L (ref 20–29)
Calcium: 9.8 mg/dL (ref 8.7–10.2)
Chloride: 104 mmol/L (ref 96–106)
Creatinine, Ser: 0.88 mg/dL (ref 0.57–1.00)
GFR calc Af Amer: 104 mL/min/{1.73_m2} (ref >59)
GFR calc non Af Amer: 90 mL/min/{1.73_m2} (ref >59)
Globulin, Total: 2.5 g/dL (ref 1.5–4.5)
Glucose: 83 mg/dL (ref 65–99)
Potassium: 4.1 mmol/L (ref 3.5–5.2)
Sodium: 138 mmol/L (ref 134–144)
Total Protein: 6.9 g/dL (ref 6.0–8.5)

## 2018-05-24 LAB — LIPID PANEL
Chol/HDL Ratio: 2.7 ratio (ref 0.0–4.4)
Cholesterol, Total: 170 mg/dL (ref 100–199)
HDL: 63 mg/dL (ref >39)
LDL Calculated: 69 mg/dL (ref 0–99)
Triglycerides: 188 mg/dL — ABNORMAL HIGH (ref 0–149)
VLDL Cholesterol Cal: 38 mg/dL (ref 5–40)

## 2018-05-24 LAB — CBC
Hematocrit: 38.2 % (ref 34.0–46.6)
Hemoglobin: 12.7 g/dL (ref 11.1–15.9)
MCH: 30 pg (ref 26.6–33.0)
MCHC: 33.2 g/dL (ref 31.5–35.7)
MCV: 90 fL (ref 79–97)
Platelets: 241 10*3/uL (ref 150–450)
RBC: 4.24 x10E6/uL (ref 3.77–5.28)
RDW: 13.3 % (ref 11.7–15.4)
WBC: 9.3 10*3/uL (ref 3.4–10.8)

## 2018-05-24 LAB — RUBELLA SCREEN: Rubella Antibodies, IGG: 3.31 index (ref >0.99)

## 2018-05-29 ENCOUNTER — Telehealth: Payer: Self-pay

## 2018-05-29 NOTE — Telephone Encounter (Signed)
Spoke with patient. Results given. Patient verbalizes understanding. Patient states that she has a PCP she sees. States this occurred last year and repeat test was normal. Patient will follow up with PCP. Encounter closed.

## 2018-05-29 NOTE — Telephone Encounter (Signed)
-----   Message from Romualdo Bolk, MD sent at 05/25/2018 10:53 AM EDT ----- Please let the patient know that her liver function is elevated and refer her to Dr Earlene Plater for evaluation.  Her triglycerides were elevated, this could happen if she wasn't fasting.  She is immune to [place]. The rest of her blood work was normal

## 2019-03-02 ENCOUNTER — Ambulatory Visit: Payer: BC Managed Care – PPO | Attending: Internal Medicine

## 2019-03-02 DIAGNOSIS — Z23 Encounter for immunization: Secondary | ICD-10-CM

## 2019-03-02 NOTE — Progress Notes (Signed)
   Covid-19 Vaccination Clinic  Name:  Keaira Whitehurst    MRN: [id number] DOB: [birth year]  03/02/2019  Ms. Pilkenton was observed post Covid-19 immunization for 15 minutes without incidence. She was provided with Vaccine Information Sheet and instruction to access the V-Safe system.   Ms. Leinbach was instructed to call 911 with any severe reactions post vaccine: Marland Kitchen Difficulty breathing  . Swelling of your face and throat  . A fast heartbeat  . A bad rash all over your body  . Dizziness and weakness    Immunizations Administered    Name Date Dose VIS Date Route   Pfizer COVID-19 Vaccine 03/02/2019  4:57 PM 0.3 mL 12/14/2018 Intramuscular   Manufacturer: [employer], [employer]   Lot: [id number]   NDC: [id number]

## 2019-03-23 ENCOUNTER — Ambulatory Visit: Payer: BC Managed Care – PPO | Attending: Internal Medicine

## 2019-03-23 DIAGNOSIS — Z23 Encounter for immunization: Secondary | ICD-10-CM

## 2019-03-23 NOTE — Progress Notes (Signed)
   Covid-19 Vaccination Clinic  Name:  Melanie Shields    MRN: [id number] DOB: February 06, 1990  03/23/2019  Melanie Shields was observed post Covid-19 immunization for 15 minutes without incident. She was provided with Vaccine Information Sheet and instruction to access the V-Safe system.   Melanie Shields was instructed to call 911 with any severe reactions post vaccine: Marland Kitchen Difficulty breathing  . Swelling of face and throat  . A fast heartbeat  . A bad rash all over body  . Dizziness and weakness   Immunizations Administered    Name Date Dose VIS Date Route   Pfizer COVID-19 Vaccine 03/23/2019  9:04 AM 0.3 mL 12/14/2018 Intramuscular   Manufacturer: [employer], [employer]   Lot: [id number]   NDC: [address]-5

## 2019-05-06 ENCOUNTER — Other Ambulatory Visit: Payer: Self-pay | Admitting: Obstetrics and Gynecology

## 2019-05-06 NOTE — Telephone Encounter (Signed)
Medication refill request: orsythia Last AEX:  05-23-2018 Next AEX: not scheduled Last MMG (if hormonal medication request): none Refill authorized: pharmacy note to state must schedule aex before further refills. Please approve if appropriate

## 2019-10-14 IMAGING — CT CT ABD-PELV W/ CM
2 of 4 series · 15 of 46 positions shown, 17 images · IV contrast (Omni 300)
Comparison: None.

CLINICAL DATA: Nausea and vomiting yesterday. Right lower quadrant
pain today. Appendicitis suspected.

EXAM:
CT ABDOMEN AND PELVIS WITH CONTRAST
TECHNIQUE: Multidetector CT imaging of the abdomen and pelvis was performed
using the standard protocol following bolus administration of
intravenous contrast.
CONTRAST:  100mL [id number] IOPAMIDOL (HX326K-HJJ) INJECTION 61%

[Series 3: a/p w/ 5mm · axial · 0.59mm/px · z∈[+761,+1106]mm · 12 of 83 slices shown, 14 images]
[im 7/83  soft-tissue]
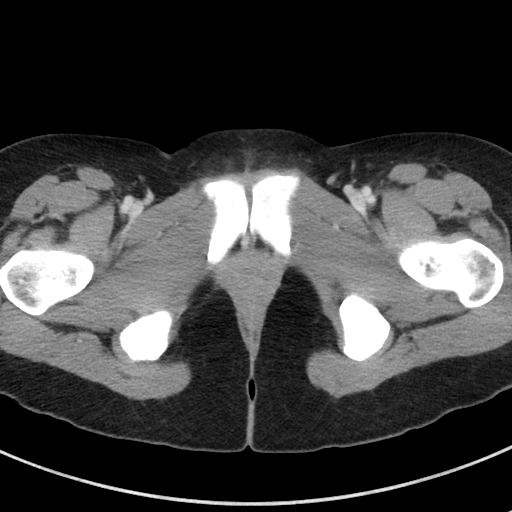
[im 7/83  bone]
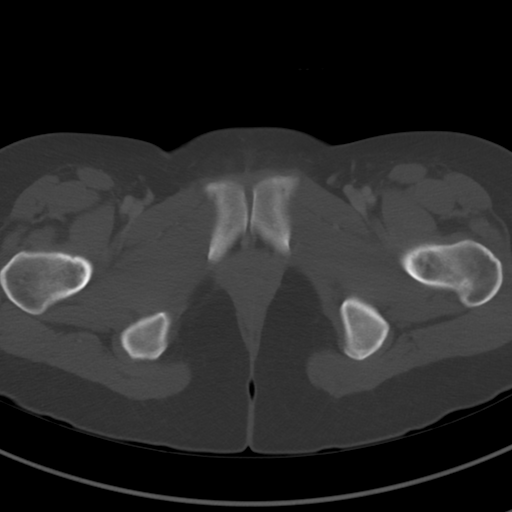
[im 13/83  soft-tissue]
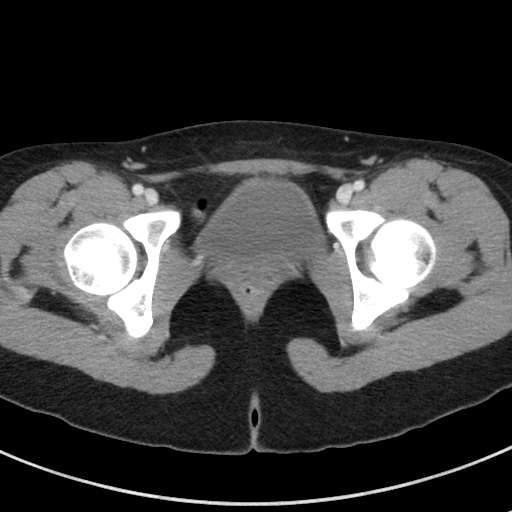
[im 19/83  soft-tissue]
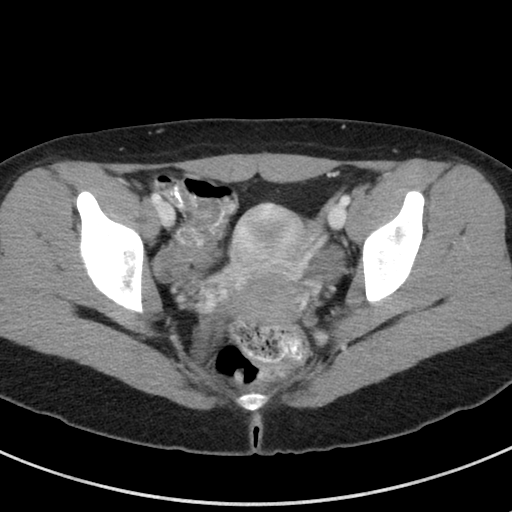
[im 26/83  soft-tissue]
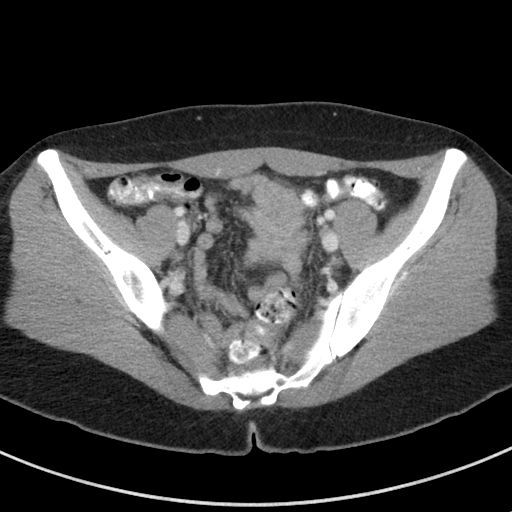
[im 32/83  soft-tissue]
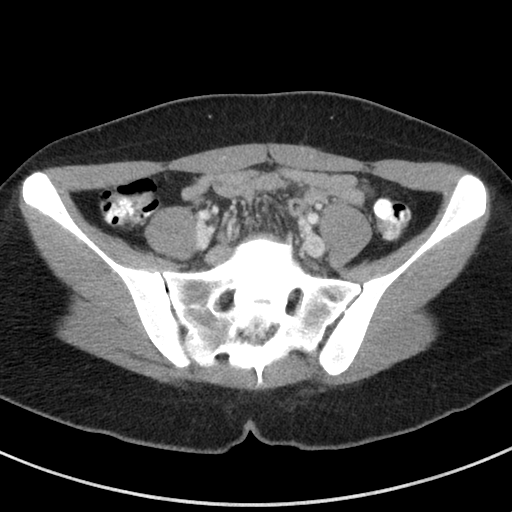
[im 38/83  soft-tissue]
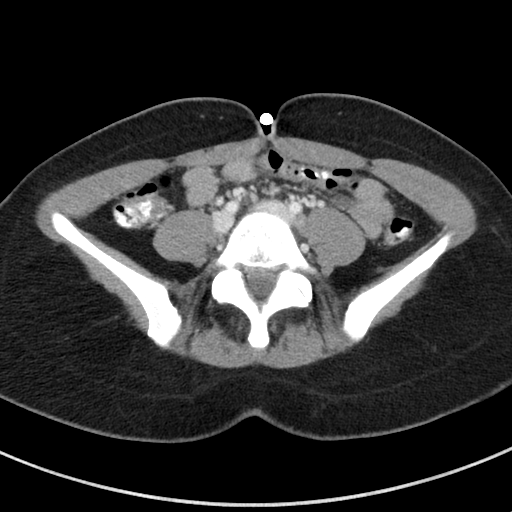
[im 45/83  soft-tissue]
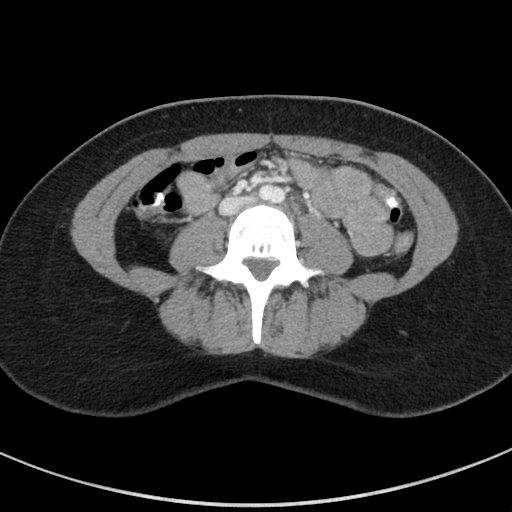
[im 51/83  soft-tissue]
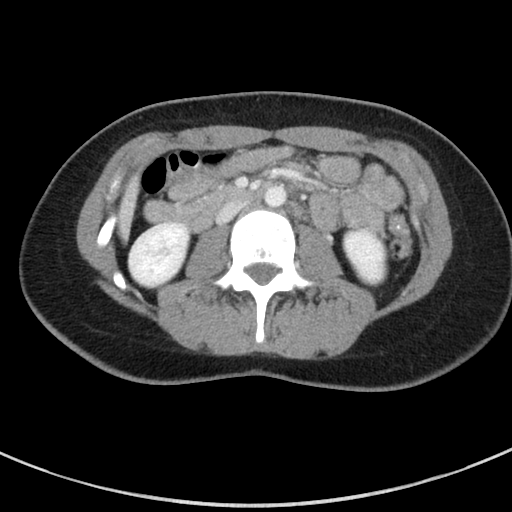
[im 57/83  soft-tissue]
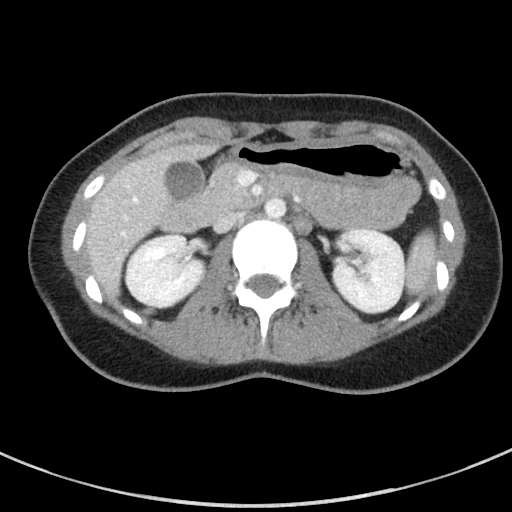
[im 57/83  bone]
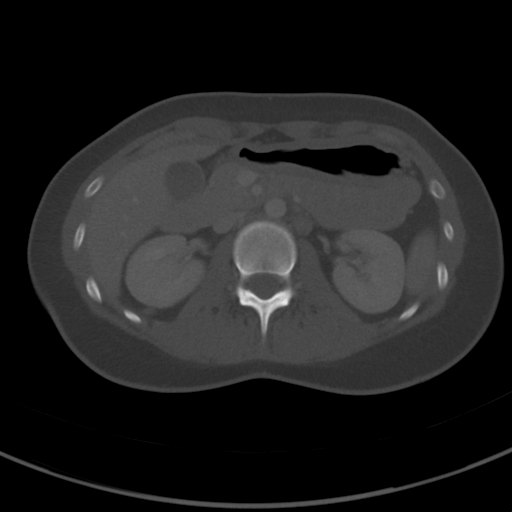
[im 64/83  soft-tissue]
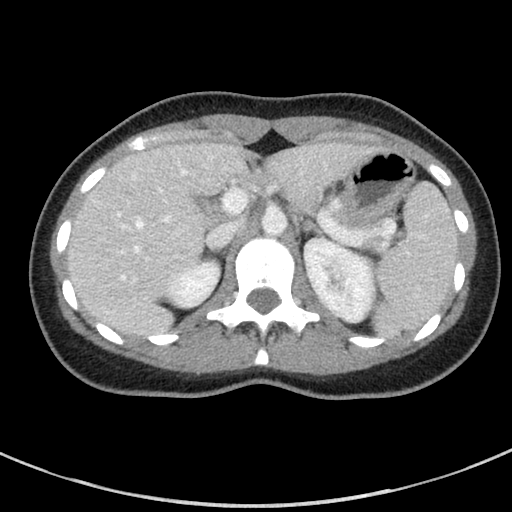
[im 70/83  soft-tissue]
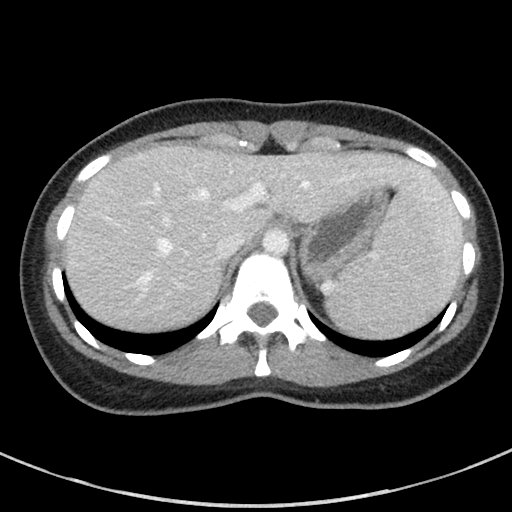
[im 76/83  soft-tissue]
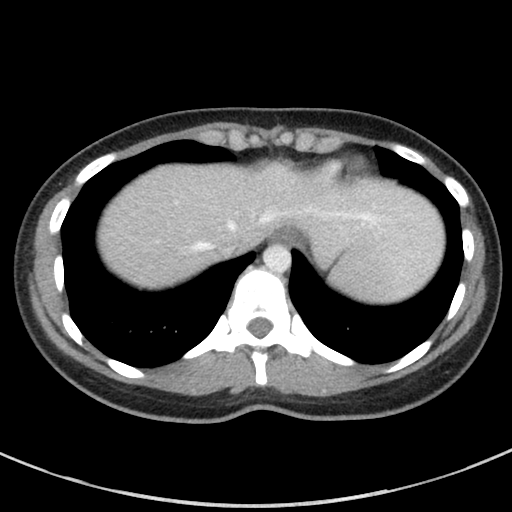

[Series 6: a/p w/ cor · coronal · 0.57mm/px · 3 of 151 slices shown]
[im 51/151  soft-tissue]
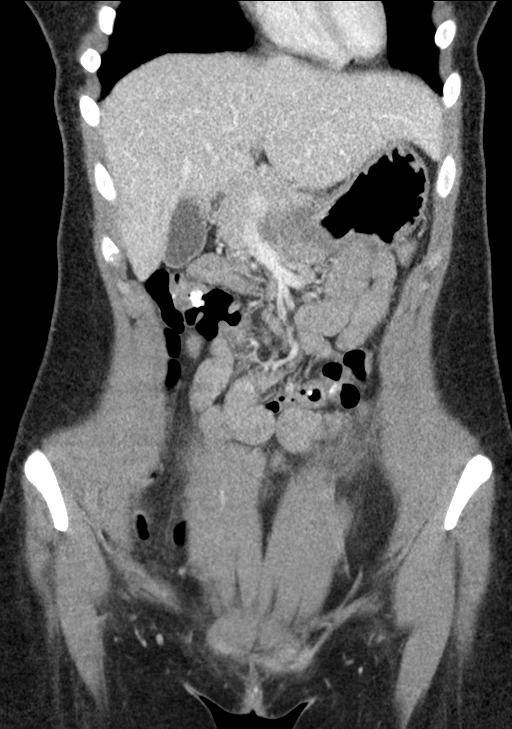
[im 67/151  soft-tissue]
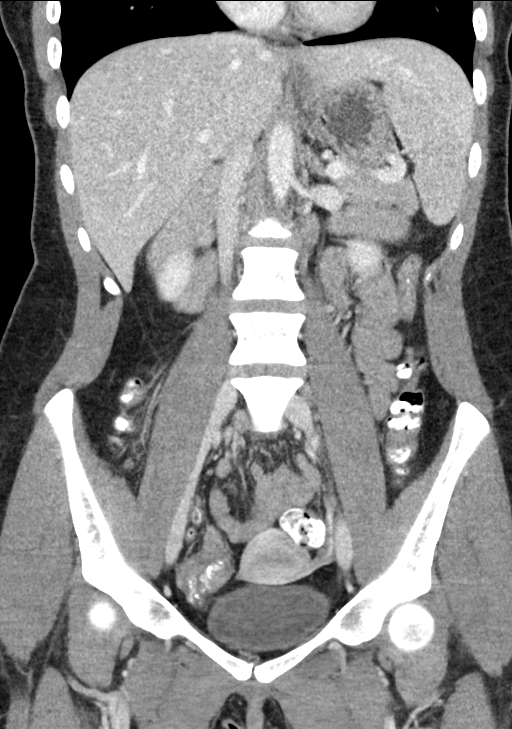
[im 84/151  soft-tissue]
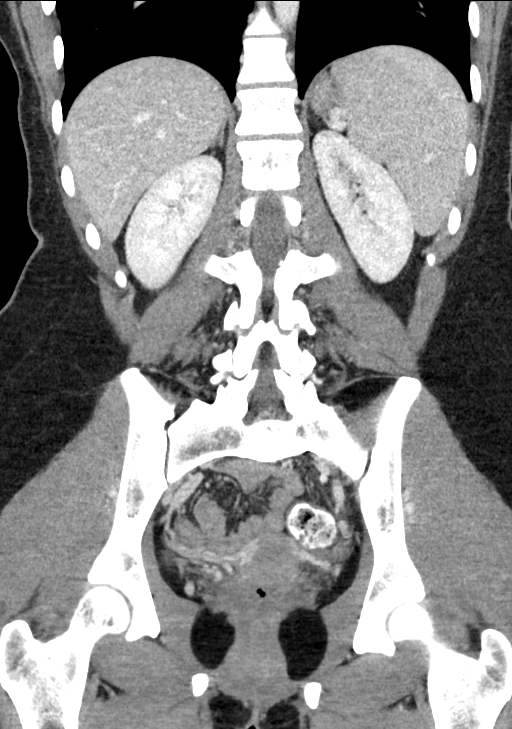

[15 of 46 positions shown; findings below may reference images not displayed]

FINDINGS: Lower chest: No acute abnormality.

Hepatobiliary: No focal liver abnormality is seen. No gallstones,
gallbladder wall thickening, or biliary dilatation.

Pancreas: Unremarkable. No pancreatic ductal dilatation or
surrounding inflammatory changes.

Spleen: Normal in size without focal abnormality.

Adrenals/Urinary Tract: Adrenal glands are unremarkable. Kidneys are
normal, without renal calculi, focal lesion, or hydronephrosis.
Bladder is unremarkable.

Stomach/Bowel: The stomach and small bowel are normal. The colon is
normal. High attenuation the proximal appendix could represent a
small appendicolith versus contrast given contrast in adjacent
colon. The appendix measures up to 7 mm which is borderline.
However, there is no adjacent stranding. The proximal appendix is
fluid-filled above the more distal appendix is thin walled and
air-filled.

Vascular/Lymphatic: The aorta and branching vessels are normal. A
few shotty nodes are seen in the left periaortic region, likely
reactive. A representative node on series 3, image 27 measures 10
mm. No adenopathy.

Reproductive: Uterus and bilateral adnexa are unremarkable.

Other: No free air or free fluid.  No other acute abnormalities.

Musculoskeletal: No acute or significant osseous findings.
IMPRESSION: 1. There is no convincing evidence of appendicitis. While there is
the possibility of a proximal appendicolith and a borderline caliber
of the proximal appendix, the lack of adjacent stranding,
significant wall thickening, and a normal distal appendix suggests
no appendicitis. If there is high clinical concern, a short-term
follow-up CT scan could be performed.
